# Patient Record
Sex: Female | Born: 1995 | Race: Black or African American | Hispanic: No | Marital: Single | Smoking: Current every day smoker
Health system: Southern US, Community
[De-identification: ages and names within clinical notes are randomized; demographics above are authoritative.]

## PROBLEM LIST (undated history)

## (undated) DIAGNOSIS — M1990 Unspecified osteoarthritis, unspecified site: Secondary | ICD-10-CM

---

## 2014-05-10 ENCOUNTER — Encounter (HOSPITAL_COMMUNITY): Payer: Self-pay | Admitting: Emergency Medicine

## 2014-05-10 ENCOUNTER — Emergency Department (HOSPITAL_COMMUNITY)
Admission: EM | Admit: 2014-05-10 | Discharge: 2014-05-11 | Disposition: A | Discharge status: Home / Self Care | Payer: Medicaid Other | Attending: Emergency Medicine | Admitting: Emergency Medicine

## 2014-05-10 DIAGNOSIS — Z3202 Encounter for pregnancy test, result negative: Secondary | ICD-10-CM | POA: Insufficient documentation

## 2014-05-10 DIAGNOSIS — R110 Nausea: Secondary | ICD-10-CM | POA: Diagnosis not present

## 2014-05-10 DIAGNOSIS — Z79899 Other long term (current) drug therapy: Secondary | ICD-10-CM | POA: Insufficient documentation

## 2014-05-10 DIAGNOSIS — Z791 Long term (current) use of non-steroidal anti-inflammatories (NSAID): Secondary | ICD-10-CM | POA: Insufficient documentation

## 2014-05-10 DIAGNOSIS — R1011 Right upper quadrant pain: Secondary | ICD-10-CM | POA: Diagnosis not present

## 2014-05-10 DIAGNOSIS — M1990 Unspecified osteoarthritis, unspecified site: Secondary | ICD-10-CM | POA: Insufficient documentation

## 2014-05-10 DIAGNOSIS — Z720 Tobacco use: Secondary | ICD-10-CM | POA: Insufficient documentation

## 2014-05-10 HISTORY — DX: Unspecified osteoarthritis, unspecified site: M19.90

## 2014-05-10 LAB — COMPREHENSIVE METABOLIC PANEL
ALT: 9 U/L (ref 0–35)
AST: 17 U/L (ref 0–37)
Albumin: 3.7 g/dL (ref 3.5–5.2)
Alkaline Phosphatase: 80 U/L (ref 39–117)
Anion gap: 12 (ref 5–15)
BUN: 9 mg/dL (ref 6–23)
CO2: 23 mEq/L (ref 19–32)
Calcium: 9.1 mg/dL (ref 8.4–10.5)
Chloride: 100 mEq/L (ref 96–112)
Creatinine, Ser: 0.8 mg/dL (ref 0.50–1.10)
GFR calc Af Amer: >90 mL/min (ref >90)
GLUCOSE: 97 mg/dL (ref 70–99)
Potassium: 3.8 mEq/L (ref 3.7–5.3)
SODIUM: 135 meq/L — AB (ref 137–147)
TOTAL PROTEIN: 7.5 g/dL (ref 6.0–8.3)
Total Bilirubin: 0.8 mg/dL (ref 0.3–1.2)

## 2014-05-10 LAB — URINE MICROSCOPIC-ADD ON

## 2014-05-10 LAB — CBC WITH DIFFERENTIAL/PLATELET
BASOS ABS: 0 10*3/uL (ref 0.0–0.1)
Basophils Relative: 0 % (ref 0–1)
Eosinophils Absolute: 0.1 10*3/uL (ref 0.0–0.7)
Eosinophils Relative: 0 % (ref 0–5)
HCT: 35.9 % — ABNORMAL LOW (ref 36.0–46.0)
Hemoglobin: 12.5 g/dL (ref 12.0–15.0)
LYMPHS ABS: 1.8 10*3/uL (ref 0.7–4.0)
LYMPHS PCT: 15 % (ref 12–46)
MCH: 32.6 pg (ref 26.0–34.0)
MCHC: 34.8 g/dL (ref 30.0–36.0)
MCV: 93.7 fL (ref 78.0–100.0)
Monocytes Absolute: 1.2 10*3/uL — ABNORMAL HIGH (ref 0.1–1.0)
Monocytes Relative: 10 % (ref 3–12)
Neutro Abs: 8.9 10*3/uL — ABNORMAL HIGH (ref 1.7–7.7)
Neutrophils Relative %: 75 % (ref 43–77)
Platelets: 251 10*3/uL (ref 150–400)
RBC: 3.83 MIL/uL — AB (ref 3.87–5.11)
RDW: 13.4 % (ref 11.5–15.5)
WBC: 12 10*3/uL — AB (ref 4.0–10.5)

## 2014-05-10 LAB — URINALYSIS, ROUTINE W REFLEX MICROSCOPIC
Bilirubin Urine: NEGATIVE
Glucose, UA: NEGATIVE mg/dL
Ketones, ur: NEGATIVE mg/dL
NITRITE: POSITIVE — AB
PH: 5 (ref 5.0–8.0)
Protein, ur: 30 mg/dL — AB
SPECIFIC GRAVITY, URINE: 1.014 (ref 1.005–1.030)
Urobilinogen, UA: 0.2 mg/dL (ref 0.0–1.0)

## 2014-05-10 LAB — LIPASE, BLOOD: LIPASE: 24 U/L (ref 11–59)

## 2014-05-10 LAB — POC URINE PREG, ED: PREG TEST UR: NEGATIVE

## 2014-05-10 NOTE — ED Provider Notes (Signed)
CSN: [id number]     Arrival date & time 05/10/14  2058 History   First MD Initiated Contact with Patient 05/10/14 2306     Chief Complaint  Patient presents with  . Abdominal Pain     (Consider location/radiation/quality/duration/timing/severity/associated sxs/prior Treatment) HPI 18 year old female complains of a few hours of acute onset of right upper quadrant and right flank pain somewhat colicky with nausea without vomiting without diarrhea without dysuria without vaginal bleeding without vaginal discharge without lower abdominal pain without trauma without treatment prior to arrival pain is almost gone now and she states when she had her pain her right upper quadrant of her abdomen was tender but is no longer tender her pain started out with moderate severity it is now minimal. She has no history of similar symptoms previously. She also has no fever no cough no shortness of breath no chest pain. Past Medical History  Diagnosis Date  . Arthritis    History reviewed. No pertinent past surgical history. No family history on file. History  Substance Use Topics  . Smoking status: Current Every Day Smoker  . Smokeless tobacco: Not on file  . Alcohol Use: Yes     Comment: twice a month   OB History    No data available     Review of Systems 10 Systems reviewed and are negative for acute change except as noted in the HPI.   Allergies  Review of patient's allergies indicates no known allergies.  Home Medications   Prior to Admission medications   Medication Sig Start Date End Date Taking? Authorizing Provider  cephALEXin (KEFLEX) 500 MG capsule 2 caps po bid x 7 days 05/11/14   Hurman HornJohn M Deara Bober, MD  etanercept (ENBREL SURECLICK) 50 MG/ML injection Inject 50 mg into the skin once a week.   Yes Historical Provider, MD  etonogestrel (NEXPLANON) 68 MG IMPL implant 1 each by Subdermal route once.   Yes Historical Provider, MD  HYDROcodone-acetaminophen (NORCO) 5-325 MG per tablet Take  2 tablets by mouth every 6 (six) hours as needed for severe pain. 05/11/14   Hurman HornJohn M Jayton Popelka, MD  meloxicam (MOBIC) 7.5 MG tablet Take 7.5 mg by mouth daily.   Yes Historical Provider, MD  methotrexate (RHEUMATREX) 2.5 MG tablet Take 15 mg by mouth once a week.   Yes Historical Provider, MD  metoCLOPramide (REGLAN) 10 MG tablet Take 1 tablet (10 mg total) by mouth every 6 (six) hours as needed for nausea (nausea/headache). 05/11/14   Hurman HornJohn M Argil Mahl, MD  ondansetron (ZOFRAN ODT) 8 MG disintegrating tablet 8mg  ODT q4 hours prn nausea 05/11/14   Hurman HornJohn M Dennard Vezina, MD   BP 105/56 mmHg  Pulse 69  Temp(Src) 97.7 F (36.5 C) (Oral)  Resp 17  Ht 5\' 2"  (1.575 m)  Wt 120 lb (54.432 kg)  BMI 21.94 kg/m2  SpO2 98%  LMP 04/26/2014 Physical Exam  Nursing note and vitals reviewed. Constitutional:  Awake, alert, nontoxic appearance.  HENT:  Head: Atraumatic.  Eyes: Right eye exhibits no discharge. Left eye exhibits no discharge.  Neck: Neck supple.  Cardiovascular: Normal rate and regular rhythm.   No murmur heard. Pulmonary/Chest: Effort normal and breath sounds normal. No respiratory distress. She has no wheezes. She has no rales. She exhibits no tenderness.  Pulse oximetry normal room air 100%  Abdominal: Soft. Bowel sounds are normal. She exhibits no distension and no mass. There is no tenderness. There is no rebound and no guarding.  Genitourinary:  No CVA tenderness  Musculoskeletal: She exhibits no tenderness.  Baseline ROM, no obvious new focal weakness.  Neurological:  Mental status and motor strength appears baseline for patient and situation.  Skin: No rash noted.  Psychiatric: She has a normal mood and affect.    ED Course  Procedures (including critical care time) Patient informed of clinical course, understand medical decision-making process, and agree with plan. Labs Review Labs Reviewed  URINALYSIS, ROUTINE W REFLEX MICROSCOPIC - Abnormal; Notable for the following:     APPearance CLOUDY (*)    Hgb urine dipstick LARGE (*)    Protein, ur 30 (*)    Nitrite POSITIVE (*)    Leukocytes, UA SMALL (*)    All other components within normal limits  CBC WITH DIFFERENTIAL - Abnormal; Notable for the following:    WBC 12.0 (*)    RBC 3.83 (*)    HCT 35.9 (*)    Neutro Abs 8.9 (*)    Monocytes Absolute 1.2 (*)    All other components within normal limits  COMPREHENSIVE METABOLIC PANEL - Abnormal; Notable for the following:    Sodium 135 (*)    All other components within normal limits  URINE MICROSCOPIC-ADD ON - Abnormal; Notable for the following:    Bacteria, UA FEW (*)    All other components within normal limits  URINE CULTURE  LIPASE, BLOOD  POC URINE PREG, ED    Imaging Review No results found. [place] Abdomen Complete  05/11/2014   CLINICAL DATA:  RIGHT upper quadrant and RIGHT flank pain, hematuria. Assess cholelithiasis or nephrolithiasis.  EXAM: ULTRASOUND ABDOMEN COMPLETE  COMPARISON:  None.  FINDINGS: Gallbladder: No gallstones or wall thickening visualized. No sonographic Murphy sign noted.  Common bile duct: Diameter: 2 mm  Liver: No focal lesion identified. Within normal limits in parenchymal echogenicity. Hepatopetal portal vein.  IVC: No abnormality visualized.  Pancreas: Visualized portion unremarkable.  Spleen: Size and appearance within normal limits.  Right Kidney: Length: 10.2 cm. Echogenicity within normal limits. No mass or hydronephrosis visualized.  Left Kidney: Length: 10.4 cm. Echogenicity within normal limits. No mass or hydronephrosis visualized.  Abdominal aorta: No aneurysm visualized.  Other findings: None.  IMPRESSION: Normal abdominal sonogram, specifically no sonographic findings of cholelithiasis nor nephrolithiasis/ obstructive uropathy.   Electronically Signed   By: Awilda Metroourtnay  Bloomer   On: 05/11/2014 00:34    EKG Interpretation None      MDM   Final diagnoses:  Abdominal pain, acute, right upper quadrant    I doubt any  other EMC precluding discharge at this time including, but not necessarily limited to the following:cholecysitis.    Hurman HornJohn M Chanson Teems, MD 05/23/14 [phone]

## 2014-05-10 NOTE — ED Notes (Addendum)
Pt c/o RLQ pain onset earlier tonight.  Nausea without vomiting.  Describes pain as sharp.  Pt denies vaginal discharge

## 2014-05-11 ENCOUNTER — Emergency Department (HOSPITAL_COMMUNITY): Payer: Medicaid Other

## 2014-05-11 MED ORDER — CEPHALEXIN 500 MG PO CAPS: ORAL_CAPSULE | ORAL | Status: DC

## 2014-05-11 MED ORDER — HYDROCODONE-ACETAMINOPHEN 5-325 MG PO TABS: 2.0000 | ORAL_TABLET | Freq: Four times a day (QID) | ORAL | Status: DC | PRN

## 2014-05-11 MED ORDER — ONDANSETRON 8 MG PO TBDP: ORAL_TABLET | ORAL | Status: DC

## 2014-05-11 MED ORDER — METOCLOPRAMIDE HCL 10 MG PO TABS: 10.0000 mg | ORAL_TABLET | Freq: Four times a day (QID) | ORAL | Status: DC | PRN

## 2014-05-11 NOTE — Discharge Instructions (Signed)

## 2014-05-13 LAB — URINE CULTURE

## 2014-05-15 ENCOUNTER — Telehealth (HOSPITAL_BASED_OUTPATIENT_CLINIC_OR_DEPARTMENT_OTHER): Payer: Self-pay | Admitting: Emergency Medicine

## 2014-05-15 NOTE — Telephone Encounter (Signed)
Post ED Visit - Positive Culture Follow-up  Culture report reviewed by antimicrobial stewardship pharmacist: [x]  Wes Dulaney, Pharm.D., BCPS []  Celedonio MiyamotoJeremy Frens, Pharm.D., BCPS []  Georgina PillionElizabeth Martin, Pharm.D., BCPS []  [place]h Pham, [place]arm.D., BCPS, AAHIVP []  Estella HuskMichelle Turner, Pharm.D., BCPS, AAHIVP []  Carly Sabat, Pharm.D. []  Enzo BiNathan Batchelder, [address] BoulevardPharm[address]  Positive urine culture Treated with Keflex, organism sensitive to the same and no further patient follow-up is required at this time.  Marcelle OverlieHolland, Jenel LucksKylie 05/15/2014, 11:53 AM

## 2015-08-22 ENCOUNTER — Encounter (HOSPITAL_COMMUNITY): Payer: Self-pay | Admitting: Emergency Medicine

## 2015-08-22 ENCOUNTER — Emergency Department (HOSPITAL_COMMUNITY)
Admission: EM | Admit: 2015-08-22 | Discharge: 2015-08-23 | Disposition: A | Discharge status: Home / Self Care | Payer: Medicaid Other | Attending: Emergency Medicine | Admitting: Emergency Medicine

## 2015-08-22 DIAGNOSIS — Z3202 Encounter for pregnancy test, result negative: Secondary | ICD-10-CM | POA: Diagnosis not present

## 2015-08-22 DIAGNOSIS — K2970 Gastritis, unspecified, without bleeding: Secondary | ICD-10-CM

## 2015-08-22 DIAGNOSIS — M1990 Unspecified osteoarthritis, unspecified site: Secondary | ICD-10-CM | POA: Insufficient documentation

## 2015-08-22 DIAGNOSIS — Z79899 Other long term (current) drug therapy: Secondary | ICD-10-CM | POA: Insufficient documentation

## 2015-08-22 DIAGNOSIS — Z791 Long term (current) use of non-steroidal anti-inflammatories (NSAID): Secondary | ICD-10-CM | POA: Diagnosis not present

## 2015-08-22 DIAGNOSIS — R1013 Epigastric pain: Secondary | ICD-10-CM | POA: Diagnosis present

## 2015-08-22 DIAGNOSIS — F17200 Nicotine dependence, unspecified, uncomplicated: Secondary | ICD-10-CM | POA: Diagnosis not present

## 2015-08-22 LAB — CBC
HCT: 37.8 % (ref 36.0–46.0)
Hemoglobin: 12.8 g/dL (ref 12.0–15.0)
MCH: 31.8 pg (ref 26.0–34.0)
MCHC: 33.9 g/dL (ref 30.0–36.0)
MCV: 94 fL (ref 78.0–100.0)
PLATELETS: 311 10*3/uL (ref 150–400)
RBC: 4.02 MIL/uL (ref 3.87–5.11)
RDW: 12.4 % (ref 11.5–15.5)
WBC: 9 10*3/uL (ref 4.0–10.5)

## 2015-08-22 LAB — URINALYSIS, ROUTINE W REFLEX MICROSCOPIC
Bilirubin Urine: NEGATIVE
GLUCOSE, UA: NEGATIVE mg/dL
HGB URINE DIPSTICK: NEGATIVE
KETONES UR: NEGATIVE mg/dL
Leukocytes, UA: NEGATIVE
Nitrite: NEGATIVE
PROTEIN: NEGATIVE mg/dL
Specific Gravity, Urine: 1.021 (ref 1.005–1.030)
pH: 6.5 (ref 5.0–8.0)

## 2015-08-22 LAB — COMPREHENSIVE METABOLIC PANEL
ALT: 17 U/L (ref 14–54)
AST: 21 U/L (ref 15–41)
Albumin: 3.6 g/dL (ref 3.5–5.0)
Alkaline Phosphatase: 65 U/L (ref 38–126)
Anion gap: 10 (ref 5–15)
BUN: 12 mg/dL (ref 6–20)
CHLORIDE: 105 mmol/L (ref 101–111)
CO2: 25 mmol/L (ref 22–32)
CREATININE: 0.82 mg/dL (ref 0.44–1.00)
Calcium: 9.3 mg/dL (ref 8.9–10.3)
GFR calc Af Amer: >60 mL/min (ref >60)
Glucose, Bld: 92 mg/dL (ref 65–99)
Potassium: 3.9 mmol/L (ref 3.5–5.1)
SODIUM: 140 mmol/L (ref 135–145)
Total Bilirubin: 0.7 mg/dL (ref 0.3–1.2)
Total Protein: 6.5 g/dL (ref 6.5–8.1)

## 2015-08-22 LAB — POC URINE PREG, ED: Preg Test, Ur: NEGATIVE

## 2015-08-22 LAB — LIPASE, BLOOD: LIPASE: 36 U/L (ref 11–51)

## 2015-08-22 MED ORDER — PANTOPRAZOLE SODIUM 40 MG PO TBEC
40.0000 mg | DELAYED_RELEASE_TABLET | Freq: Once | ORAL | Status: AC
  Administered 2015-08-22: 40 mg via ORAL
  Filled 2015-08-22: qty 1

## 2015-08-22 MED ORDER — ONDANSETRON 4 MG PO TBDP
4.0000 mg | ORAL_TABLET | Freq: Once | ORAL | Status: AC | PRN
  Administered 2015-08-22: 4 mg via ORAL

## 2015-08-22 MED ORDER — ONDANSETRON 4 MG PO TBDP
ORAL_TABLET | ORAL | Status: AC
  Filled 2015-08-22: qty 1

## 2015-08-22 MED ORDER — ONDANSETRON 4 MG PO TBDP
4.0000 mg | ORAL_TABLET | Freq: Once | ORAL | Status: AC
  Administered 2015-08-22: 4 mg via ORAL
  Filled 2015-08-22: qty 1

## 2015-08-22 MED ORDER — HYDROCODONE-ACETAMINOPHEN 5-325 MG PO TABS
1.0000 | ORAL_TABLET | Freq: Once | ORAL | Status: AC
  Administered 2015-08-22: 1 via ORAL
  Filled 2015-08-22: qty 1

## 2015-08-22 NOTE — ED Notes (Signed)
Pt. reports mid abdominal pain with nausea onset last week , denies emesis or diarrhea , no fever or chills .  

## 2015-08-23 MED ORDER — OMEPRAZOLE 20 MG PO CPDR: 20.0000 mg | DELAYED_RELEASE_CAPSULE | Freq: Two times a day (BID) | ORAL | Status: DC

## 2015-08-23 MED ORDER — ONDANSETRON 4 MG PO TBDP: 4.0000 mg | ORAL_TABLET | Freq: Three times a day (TID) | ORAL | Status: DC | PRN

## 2015-08-23 MED ORDER — HYDROCODONE-ACETAMINOPHEN 5-325 MG PO TABS: 1.0000 | ORAL_TABLET | ORAL | Status: DC | PRN

## 2015-08-23 MED ORDER — SUCRALFATE 1 G PO TABS: 1.0000 g | ORAL_TABLET | Freq: Four times a day (QID) | ORAL | Status: DC

## 2015-08-23 NOTE — Discharge Instructions (Signed)

## 2015-08-25 NOTE — ED Provider Notes (Signed)
CSN: [id number]     Arrival date & time 08/22/15  2208 History   First MD Initiated Contact with Patient 08/22/15 2253     Chief Complaint  Patient presents with  . Abdominal Pain      HPI  History presents for evaluation of abdominal pain some mid epigastric and upper abdominal pain over the last several days. States it makes for full when she eats has some irritation of her upper abdomen with nausea. No vomiting. No diarrhea. No dysuria free C hematuria. Past history significant for rheumatoid arthritis. Takes methotrexate, Mavik, and Enbrel.  Past Medical History  Diagnosis Date  . Arthritis    History reviewed. No pertinent past surgical history. No family history on file. Social History  Substance Use Topics  . Smoking status: Current Every Day Smoker  . Smokeless tobacco: None  . Alcohol Use: Yes     Comment: twice a month   OB History    No data available     Review of Systems  Constitutional: Negative for fever, chills, diaphoresis, appetite change and fatigue.  HENT: Negative for mouth sores, sore throat and trouble swallowing.   Eyes: Negative for visual disturbance.  Respiratory: Negative for cough, chest tightness, shortness of breath and wheezing.   Cardiovascular: Negative for chest pain.  Gastrointestinal: Positive for abdominal pain. Negative for nausea, vomiting, diarrhea and abdominal distention.  Endocrine: Negative for polydipsia, polyphagia and polyuria.  Genitourinary: Negative for dysuria, frequency and hematuria.  Musculoskeletal: Negative for gait problem.  Skin: Negative for color change, pallor and rash.  Neurological: Negative for dizziness, syncope, light-headedness and headaches.  Hematological: Does not bruise/bleed easily.  Psychiatric/Behavioral: Negative for behavioral problems and confusion.      Allergies  Review of patient's allergies indicates no known allergies.  Home Medications   Prior to Admission medications   Medication  Sig Start Date End Date Taking? Authorizing Provider  cephALEXin (KEFLEX) 500 MG capsule 2 caps po bid x 7 days 05/11/14   Wayland Salinas, MD  etanercept (ENBREL SURECLICK) 50 MG/ML injection Inject 50 mg into the skin once a week.    Historical Provider, MD  etonogestrel (NEXPLANON) 68 MG IMPL implant 1 each by Subdermal route once.    Historical Provider, MD  HYDROcodone-acetaminophen (NORCO/VICODIN) 5-325 MG tablet Take 1 tablet by mouth every 4 (four) hours as needed. 08/23/15   Rolland Porter, MD  meloxicam (MOBIC) 7.5 MG tablet Take 7.5 mg by mouth daily.    Historical Provider, MD  methotrexate (RHEUMATREX) 2.5 MG tablet Take 15 mg by mouth once a week.    Historical Provider, MD  metoCLOPramide (REGLAN) 10 MG tablet Take 1 tablet (10 mg total) by mouth every 6 (six) hours as needed for nausea (nausea/headache). 05/11/14   Wayland Salinas, MD  omeprazole (PRILOSEC) 20 MG capsule Take 1 capsule (20 mg total) by mouth 2 (two) times daily. 08/23/15   Rolland Porter, MD  ondansetron (ZOFRAN ODT) 4 MG disintegrating tablet Take 1 tablet (4 mg total) by mouth every 8 (eight) hours as needed for nausea. 08/23/15   Rolland Porter, MD  sucralfate (CARAFATE) 1 g tablet Take 1 tablet (1 g total) by mouth 4 (four) times daily. 08/23/15   Rolland Porter, MD   BP 108/75 mmHg  Pulse 64  Temp(Src) 97.5 F (36.4 C) (Oral)  Resp 18  SpO2 100%  LMP 08/15/2015 (Approximate) Physical Exam  Constitutional: She is oriented to person, place, and time. She appears well-developed and well-nourished. No distress.  HENT:  Head: Normocephalic.  Eyes: Conjunctivae are normal. Pupils are equal, round, and reactive to light. No scleral icterus.  Neck: Normal range of motion. Neck supple. No thyromegaly present.  Cardiovascular: Normal rate and regular rhythm.  Exam reveals no gallop and no friction rub.   No murmur heard. Pulmonary/Chest: Effort normal and breath sounds normal. No respiratory distress. She has no wheezes. She has no rales.    Abdominal: Soft. Bowel sounds are normal. She exhibits no distension. There is no tenderness. There is no rebound.    Musculoskeletal: Normal range of motion.  Neurological: She is alert and oriented to person, place, and time.  Skin: Skin is warm and dry. No rash noted.  Psychiatric: She has a normal mood and affect. Her behavior is normal.    ED Course  Procedures (including critical care time) Labs Review Labs Reviewed  URINALYSIS, ROUTINE W REFLEX MICROSCOPIC (NOT AT [facility]) - Abnormal; Notable for the following:    APPearance HAZY (*)    All other components within normal limits  LIPASE, BLOOD  COMPREHENSIVE METABOLIC PANEL  CBC  POC URINE PREG, ED    Imaging Review No results found. I have personally reviewed and evaluated these images and lab results as part of my medical decision-making.   EKG Interpretation None      MDM   Final diagnoses:  Gastritis    Reassuring evaluation and workup. I'm aware that she takes Enbrel. No sign of infectious etiology. Symptoms are likely gastritis. We'll hold her mobile. Plan Carafate and Protonix. Primary care follow-up. Avoid alcohol tobacco caffeine anti-inflammatories.    Rolland Porter, MD 08/25/15 [phone]

## 2015-10-10 DIAGNOSIS — Z79899 Other long term (current) drug therapy: Secondary | ICD-10-CM | POA: Insufficient documentation

## 2015-10-10 DIAGNOSIS — M79671 Pain in right foot: Secondary | ICD-10-CM | POA: Diagnosis present

## 2015-10-10 DIAGNOSIS — F17200 Nicotine dependence, unspecified, uncomplicated: Secondary | ICD-10-CM | POA: Insufficient documentation

## 2015-10-10 DIAGNOSIS — M79674 Pain in right toe(s): Secondary | ICD-10-CM | POA: Diagnosis not present

## 2015-10-10 DIAGNOSIS — M1990 Unspecified osteoarthritis, unspecified site: Secondary | ICD-10-CM | POA: Diagnosis not present

## 2015-10-10 DIAGNOSIS — Z791 Long term (current) use of non-steroidal anti-inflammatories (NSAID): Secondary | ICD-10-CM | POA: Insufficient documentation

## 2015-10-11 ENCOUNTER — Emergency Department (HOSPITAL_COMMUNITY): Payer: Medicaid Other

## 2015-10-11 ENCOUNTER — Emergency Department (HOSPITAL_COMMUNITY)
Admission: EM | Admit: 2015-10-11 | Discharge: 2015-10-11 | Disposition: A | Discharge status: Home / Self Care | Payer: Medicaid Other | Attending: Emergency Medicine | Admitting: Emergency Medicine

## 2015-10-11 ENCOUNTER — Encounter (HOSPITAL_COMMUNITY): Payer: Self-pay | Admitting: Emergency Medicine

## 2015-10-11 DIAGNOSIS — M79674 Pain in right toe(s): Secondary | ICD-10-CM

## 2015-10-11 MED ORDER — OXYCODONE-ACETAMINOPHEN 5-325 MG PO TABS: 1.0000 | ORAL_TABLET | Freq: Four times a day (QID) | ORAL | Status: DC | PRN

## 2015-10-11 MED ORDER — OXYCODONE-ACETAMINOPHEN 5-325 MG PO TABS
ORAL_TABLET | ORAL | Status: AC
  Filled 2015-10-11: qty 1

## 2015-10-11 MED ORDER — OXYCODONE-ACETAMINOPHEN 5-325 MG PO TABS
1.0000 | ORAL_TABLET | ORAL | Status: DC | PRN
Start: 2015-10-11 — End: 2015-10-11
  Administered 2015-10-11: 1 via ORAL

## 2015-10-11 NOTE — Discharge Instructions (Signed)
You were seen today for pain in your right great toe. Your workup is reassuring. Her x-rays negative. This could be related to your known arthritis. He will be given a short course of additional pain medication. If your symptoms worsen, you need to follow-up with your primary physician. If you develop redness or fever, you should be reevaluated.  Joint Pain Joint pain, which is also called arthralgia, can be caused by many things. Joint pain often goes away when you follow your health care provider's instructions for relieving pain at home. However, joint pain can also be caused by conditions that require further treatment. Common causes of joint pain include:  Bruising in the area of the joint.  Overuse of the joint.  Wear and tear on the joints that occur with aging (osteoarthritis).  Various other forms of arthritis.  A buildup of a crystal form of uric acid in the joint (gout).  Infections of the joint (septic arthritis) or of the bone (osteomyelitis). Your health care provider may recommend medicine to help with the pain. If your joint pain continues, additional tests may be needed to diagnose your condition. HOME CARE INSTRUCTIONS Watch your condition for any changes. Follow these instructions as directed to lessen the pain that you are feeling.  Take medicines only as directed by your health care provider.  Rest the affected area for as long as your health care provider says that you should. If directed to do so, raise the painful joint above the level of your heart while you are sitting or lying down.  Do not do things that cause or worsen pain.  If directed, apply ice to the painful area:  Put ice in a plastic bag.  Place a towel between your skin and the bag.  Leave the ice on for 20 minutes, 2-3 times per day.  Wear an elastic bandage, splint, or sling as directed by your health care provider. Loosen the elastic bandage or splint if your fingers or toes become numb and  tingle, or if they turn cold and blue.  Begin exercising or stretching the affected area as directed by your health care provider. Ask your health care provider what types of exercise are safe for you.  Keep all follow-up visits as directed by your health care provider. This is important. SEEK MEDICAL CARE IF:  Your pain increases, and medicine does not help.  Your joint pain does not improve within 3 days.  You have increased bruising or swelling.  You have a fever.  You lose 10 lb (4.5 kg) or more without trying. SEEK IMMEDIATE MEDICAL CARE IF:  You are not able to move the joint.  Your fingers or toes become numb or they turn cold and blue.   This information is not intended to replace advice given to you by your health care provider. Make sure you discuss any questions you have with your health care provider.   Document Released: 07/09/2005 Document Revised: 07/30/2014 Document Reviewed: 04/20/2014 Elsevier Interactive Patient Education [employer]2016 [employer] Inc.

## 2015-10-11 NOTE — ED Notes (Signed)
Pt. Reports left foot pain with mild swelling for 2 weeks , denies injury , pain increases when walking/weight bearing , history of arthritis .

## 2015-10-11 NOTE — ED Provider Notes (Signed)
CSN: [id number]     Arrival date & time 10/10/15  2356 History  By signing my name below, I, Bethel BornBritney McCollum, attest that this documentation has been prepared under the direction and in the presence of Shon Batonourtney F Seila Liston, MD. Electronically Signed: Bethel BornBritney McCollum, ED Scribe. 10/11/2015. 2:19 AM  Chief Complaint  Patient presents with  . Foot Pain   The history is provided by the patient. No language interpreter was used.   Erin Huang is a 20 y.o. female with history of arthritis who presents to the Emergency Department complaining of new, constant, atraumatic, sharp/throbbing, 10/10 in severity at worse, 2/10 in severity at present, left foot pain and swelling with onset 1 week ago. She notes that the pain is exacerbated by weight bearing and worse at night.  This pain is different than her typical arthritis pain which is dull and in her hands. Her arthritis is treated with meloxicam and weekly injections of Enbrel Sureclick.   Pt denies fever.   Past Medical History  Diagnosis Date  . Arthritis    History reviewed. No pertinent past surgical history. No family history on file. Social History  Substance Use Topics  . Smoking status: Current Every Day Smoker  . Smokeless tobacco: None  . Alcohol Use: Yes     Comment: twice a month   OB History    No data available     Review of Systems  Constitutional: Negative for fever.  Musculoskeletal:       Left foot pain   All other systems reviewed and are negative.     Allergies  Review of patient's allergies indicates no known allergies.  Home Medications   Prior to Admission medications   Medication Sig Start Date End Date Taking? Authorizing Provider  cephALEXin (KEFLEX) 500 MG capsule 2 caps po bid x 7 days 05/11/14   Wayland SalinasJohn Bednar, MD  etanercept (ENBREL SURECLICK) 50 MG/ML injection Inject 50 mg into the skin once a week.    Historical Provider, MD  etonogestrel (NEXPLANON) 68 MG IMPL implant 1 each by Subdermal route  once.    Historical Provider, MD  HYDROcodone-acetaminophen (NORCO/VICODIN) 5-325 MG tablet Take 1 tablet by mouth every 4 (four) hours as needed. 08/23/15   Rolland PorterMark James, MD  meloxicam (MOBIC) 7.5 MG tablet Take 7.5 mg by mouth daily.    Historical Provider, MD  methotrexate (RHEUMATREX) 2.5 MG tablet Take 15 mg by mouth once a week.    Historical Provider, MD  metoCLOPramide (REGLAN) 10 MG tablet Take 1 tablet (10 mg total) by mouth every 6 (six) hours as needed for nausea (nausea/headache). 05/11/14   Wayland SalinasJohn Bednar, MD  omeprazole (PRILOSEC) 20 MG capsule Take 1 capsule (20 mg total) by mouth 2 (two) times daily. 08/23/15   Rolland PorterMark James, MD  ondansetron (ZOFRAN ODT) 4 MG disintegrating tablet Take 1 tablet (4 mg total) by mouth every 8 (eight) hours as needed for nausea. 08/23/15   Rolland PorterMark James, MD  oxyCODONE-acetaminophen (PERCOCET/ROXICET) 5-325 MG tablet Take 1 tablet by mouth every 6 (six) hours as needed for moderate pain. 10/11/15   Shon Batonourtney F Giavonni Fonder, MD  sucralfate (CARAFATE) 1 g tablet Take 1 tablet (1 g total) by mouth 4 (four) times daily. 08/23/15   Rolland PorterMark James, MD   BP 104/65 mmHg  Pulse 86  Temp(Src) 98.4 F (36.9 C) (Oral)  Resp 16  Ht 5\' 2"  (1.575 m)  Wt 132 lb (59.875 kg)  BMI 24.14 kg/m2  SpO2 99%  LMP 10/06/2015 (Approximate)  Physical Exam  Constitutional: She is oriented to person, place, and time. She appears well-developed and well-nourished.  HENT:  Head: Normocephalic and atraumatic.  Cardiovascular: Normal rate, regular rhythm and normal heart sounds.   Pulmonary/Chest: Effort normal and breath sounds normal. No respiratory distress.  Musculoskeletal: She exhibits no edema.  Normal range of motion of the left great toe, no obvious swelling, no overlying skin changes, no warmth or erythema noted, 2+ DP pulse  Neurological: She is alert and oriented to person, place, and time.  Skin: Skin is warm and dry.  Psychiatric: She has a normal mood and affect.  Nursing note and  vitals reviewed.   ED Course  Procedures (including critical care time) DIAGNOSTIC STUDIES: Oxygen Saturation is 99% on RA,  normal by my interpretation.    COORDINATION OF CARE: 2:15 AM Discussed treatment plan which includes left foot XR and pain management with pt at bedside and pt agreed to plan.  Labs Review Labs Reviewed - No data to display  Imaging Review Dg Toe Great Left  10/11/2015  CLINICAL DATA:  Left great toe pain, 2 weeks duration. EXAM: LEFT GREAT TOE COMPARISON:  None. FINDINGS: There is no evidence of fracture or dislocation. There is no evidence of arthropathy or other focal bone abnormality. Soft tissues are unremarkable. IMPRESSION: Negative. Electronically Signed   By: Ellery Plunk M.D.   On: 10/11/2015 02:52   I have personally reviewed and evaluated these images as part of my medical decision-making.   EKG Interpretation None      MDM   Final diagnoses:  Great toe pain, right    Patient presents with right great toe pain. 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 Pain improved with pain medication in triage. There are no obvious signs of infection. No obvious swelling. There is some tenderness to palpation. Denies injury. Plain films are negative. While patient states that this feels different than her arthritis, this could be related. Will discharge home with a short course of additional pain medication given that the patient has artery on an anti-inflammatory. Follow-up with primary physician.  After history, exam, and medical workup I feel the patient has been appropriately medically screened and is safe for discharge home. Pertinent diagnoses were discussed with the patient. Patient was given return precautions.  I personally performed the services described in this documentation, which was scribed in my presence. The recorded information has been reviewed and is accurate.    Shon Baton, MD 10/11/15 [phone]

## 2017-10-01 ENCOUNTER — Emergency Department (HOSPITAL_COMMUNITY)
Admission: EM | Admit: 2017-10-01 | Discharge: 2017-10-02 | Disposition: A | Discharge status: Home / Self Care | Payer: BLUE CROSS/BLUE SHIELD | Attending: Emergency Medicine | Admitting: Emergency Medicine

## 2017-10-01 ENCOUNTER — Encounter (HOSPITAL_COMMUNITY): Payer: Self-pay | Admitting: Emergency Medicine

## 2017-10-01 ENCOUNTER — Other Ambulatory Visit: Payer: Self-pay

## 2017-10-01 DIAGNOSIS — R509 Fever, unspecified: Secondary | ICD-10-CM | POA: Insufficient documentation

## 2017-10-01 DIAGNOSIS — R112 Nausea with vomiting, unspecified: Secondary | ICD-10-CM | POA: Insufficient documentation

## 2017-10-01 DIAGNOSIS — Z79899 Other long term (current) drug therapy: Secondary | ICD-10-CM | POA: Insufficient documentation

## 2017-10-01 DIAGNOSIS — F17200 Nicotine dependence, unspecified, uncomplicated: Secondary | ICD-10-CM | POA: Diagnosis not present

## 2017-10-01 DIAGNOSIS — R1013 Epigastric pain: Secondary | ICD-10-CM | POA: Insufficient documentation

## 2017-10-01 DIAGNOSIS — R1011 Right upper quadrant pain: Secondary | ICD-10-CM | POA: Diagnosis present

## 2017-10-01 DIAGNOSIS — R52 Pain, unspecified: Secondary | ICD-10-CM

## 2017-10-01 LAB — URINALYSIS, ROUTINE W REFLEX MICROSCOPIC
BILIRUBIN URINE: NEGATIVE
Glucose, UA: NEGATIVE mg/dL
Hgb urine dipstick: NEGATIVE
Ketones, ur: NEGATIVE mg/dL
LEUKOCYTES UA: NEGATIVE
NITRITE: NEGATIVE
Protein, ur: NEGATIVE mg/dL
SPECIFIC GRAVITY, URINE: 1.005 (ref 1.005–1.030)
pH: 6 (ref 5.0–8.0)

## 2017-10-01 LAB — COMPREHENSIVE METABOLIC PANEL
ALK PHOS: 60 U/L (ref 38–126)
ALT: 18 U/L (ref 14–54)
ANION GAP: 10 (ref 5–15)
AST: 22 U/L (ref 15–41)
Albumin: 3.8 g/dL (ref 3.5–5.0)
BUN: 8 mg/dL (ref 6–20)
CO2: 23 mmol/L (ref 22–32)
Calcium: 8.7 mg/dL — ABNORMAL LOW (ref 8.9–10.3)
Chloride: 99 mmol/L — ABNORMAL LOW (ref 101–111)
Creatinine, Ser: 0.85 mg/dL (ref 0.44–1.00)
GLUCOSE: 102 mg/dL — AB (ref 65–99)
POTASSIUM: 3.4 mmol/L — AB (ref 3.5–5.1)
Sodium: 132 mmol/L — ABNORMAL LOW (ref 135–145)
TOTAL PROTEIN: 7.1 g/dL (ref 6.5–8.1)
Total Bilirubin: 1.3 mg/dL — ABNORMAL HIGH (ref 0.3–1.2)

## 2017-10-01 LAB — LIPASE, BLOOD: Lipase: 26 U/L (ref 11–51)

## 2017-10-01 LAB — CBC
HEMATOCRIT: 39.9 % (ref 36.0–46.0)
Hemoglobin: 13.7 g/dL (ref 12.0–15.0)
MCH: 33 pg (ref 26.0–34.0)
MCHC: 34.3 g/dL (ref 30.0–36.0)
MCV: 96.1 fL (ref 78.0–100.0)
Platelets: 262 10*3/uL (ref 150–400)
RBC: 4.15 MIL/uL (ref 3.87–5.11)
RDW: 12.8 % (ref 11.5–15.5)
WBC: 6.6 10*3/uL (ref 4.0–10.5)

## 2017-10-01 LAB — I-STAT BETA HCG BLOOD, ED (MC, WL, AP ONLY)

## 2017-10-01 MED ORDER — ONDANSETRON 4 MG PO TBDP
4.0000 mg | ORAL_TABLET | Freq: Once | ORAL | Status: AC | PRN
  Administered 2017-10-01: 4 mg via ORAL
  Filled 2017-10-01: qty 1

## 2017-10-01 NOTE — ED Triage Notes (Signed)
Pt reports multiple complaints that that include headache, n/v/stomach cramping, constipation, body aches, and general weakness.

## 2017-10-02 ENCOUNTER — Emergency Department (HOSPITAL_COMMUNITY): Payer: BLUE CROSS/BLUE SHIELD

## 2017-10-02 MED ORDER — SODIUM CHLORIDE 0.9 % IV BOLUS (SEPSIS)
1000.0000 mL | Freq: Once | INTRAVENOUS | Status: AC
  Administered 2017-10-02: 1000 mL via INTRAVENOUS

## 2017-10-02 MED ORDER — OMEPRAZOLE 20 MG PO CPDR: 20.0000 mg | DELAYED_RELEASE_CAPSULE | Freq: Every day | ORAL | 0 refills | Status: DC

## 2017-10-02 MED ORDER — FENTANYL CITRATE (PF) 100 MCG/2ML IJ SOLN
50.0000 ug | Freq: Once | INTRAMUSCULAR | Status: AC
  Administered 2017-10-02: 50 ug via INTRAVENOUS
  Filled 2017-10-02: qty 2

## 2017-10-02 NOTE — ED Provider Notes (Signed)
[facility] EMERGENCY DEPARTMENT Provider Note   CSN: [id number] Arrival date & time: 10/01/17  2132     History   Chief Complaint Chief Complaint  Patient presents with  . multiple complaints    HPI Erin Huang is a 22 y.o. female.  The history is provided by the patient.  Abdominal Pain   This is a new problem. The current episode started 2 days ago. The problem occurs daily. The problem has been gradually worsening. The pain is located in the RUQ and epigastric region. The quality of the pain is cramping. The pain is moderate. Associated symptoms include fever, nausea and vomiting. Pertinent negatives include dysuria. The symptoms are aggravated by palpation and certain positions. Nothing relieves the symptoms.  Patient reports onset of nausea vomiting upper abdominal pain over the past 1-2 days.  She also reports feeling feverish.  No chest pain or shortness of breath.  No lower abdominal pain.  No dysuria   Past Medical History:  Diagnosis Date  . Arthritis     There are no active problems to display for this patient.   History reviewed. No pertinent surgical history.  OB History    No data available       Home Medications    Prior to Admission medications   Medication Sig Start Date End Date Taking? Authorizing Provider  etanercept (ENBREL SURECLICK) 50 MG/ML injection Inject 50 mg into the skin 2 (two) times a week.    Yes [provider]  meloxicam (MOBIC) 7.5 MG tablet Take 7.5 mg by mouth daily.   Yes [provider]  methotrexate (RHEUMATREX) 2.5 MG tablet Take 15 mg by mouth once a week. On Sunday   Yes [provider]  omeprazole (PRILOSEC) 20 MG capsule Take 1 capsule (20 mg total) by mouth daily. 10/02/17   Zadie Rhine, MD    Family History No family history on file.  Social History Social History   Tobacco Use  . Smoking status: Current Every Day Smoker  Substance Use Topics  . Alcohol use:  Yes    Comment: twice a month  . Drug use: Yes    Types: Marijuana     Allergies   Patient has no known allergies.   Review of Systems Review of Systems  Constitutional: Positive for fever.  Gastrointestinal: Positive for abdominal pain, nausea and vomiting.  Genitourinary: Negative for dysuria.  All other systems reviewed and are negative.    Physical Exam Updated Vital Signs BP 98/67   Pulse 75   Temp 99.5 F (37.5 C) (Oral)   Resp 18   Ht 1.575 m (5\' 2" )   Wt 59 kg (130 lb)   SpO2 99%   BMI 23.78 kg/m   Physical Exam CONSTITUTIONAL: Well developed/well nourished HEAD: Normocephalic/atraumatic EYES: EOMI/PERRL, no icterus ENMT: Mucous membranes moist NECK: supple no meningeal signs SPINE/BACK:entire spine nontender CV: S1/S2 noted, no murmurs/rubs/gallops noted LUNGS: Lungs are clear to auscultation bilaterally, no apparent distress ABDOMEN: soft, mild right upper quadrant and, no rebound or guarding, bowel sounds noted throughout abdomen GU:no cva tenderness NEURO: Pt is awake/alert/appropriate, moves all extremitiesx4.  No facial droop.   EXTREMITIES: pulses normal/equal, full ROM SKIN: warm, color normal PSYCH: no abnormalities of mood noted, alert and oriented to situation   ED Treatments / Results  Labs (all labs ordered are listed, but only abnormal results are displayed) Labs Reviewed  COMPREHENSIVE METABOLIC PANEL - Abnormal; Notable for the following components:  Result Value   Sodium 132 (*)    Potassium 3.4 (*)    Chloride 99 (*)    Glucose, Bld 102 (*)    Calcium 8.7 (*)    Total Bilirubin 1.3 (*)    All other components within normal limits  URINALYSIS, ROUTINE W REFLEX MICROSCOPIC - Abnormal; Notable for the following components:   Color, Urine STRAW (*)    All other components within normal limits  LIPASE, BLOOD  CBC  I-STAT BETA HCG BLOOD, ED ([facility], [facility], [facility] ONLY)    EKG  EKG Interpretation None       Radiology [place]  Abdomen Limited Ruq  Result Date: 10/02/2017 CLINICAL DATA:  Right upper quadrant pain EXAM: ULTRASOUND ABDOMEN LIMITED RIGHT UPPER QUADRANT COMPARISON:  None. FINDINGS: Gallbladder: No gallstones or wall thickening visualized. No sonographic Murphy sign noted by sonographer. Common bile duct: Diameter: 3 mm Liver: No focal lesion identified. Within normal limits in parenchymal echogenicity. Portal vein is patent on color Doppler imaging with normal direction of blood flow towards the liver. IMPRESSION: Normal right upper quadrant ultrasound Electronically Signed   By: Deatra RobinsonKevin  Herman M.D.   On: 10/02/2017 06:48    Procedures Procedures   Medications Ordered in ED Medications  ondansetron (ZOFRAN-ODT) disintegrating tablet 4 mg (4 mg Oral Given 10/01/17 2229)  sodium chloride 0.9 % bolus 1,000 mL (1,000 mLs Intravenous New Bag/Given 10/02/17 0536)  fentaNYL (SUBLIMAZE) injection 50 mcg (50 mcg Intravenous Given 10/02/17 0536)     Initial Impression / Assessment and Plan / ED Course  I have reviewed the triage vital signs and the nursing notes.  Pertinent labs  results that were available during my care of the patient were reviewed by me and considered in my medical decision making (see chart for details).     Patient presents with reported fevers, nausea vomiting and abdominal pain.  She is focally tender in her upper abdomen.  Ultrasound imaging is been negative. She is feeling improved.  Vitals appropriate We will discharge home  Patient is appropriate for d/c home.  I doubt acute abdominal emergency at this time.  We discussed strict ER return precautions including abdominal pain that migrates to RLQ, fever >100.7F over next 8-12 hours  Final Clinical Impressions(s) / ED Diagnoses   Final diagnoses:  Pain  Epigastric abdominal pain    ED Discharge Orders        Ordered    omeprazole (PRILOSEC) 20 MG capsule  Daily     10/02/17 0714       Zadie RhineWickline, Eliel Dudding, MD 10/02/17  [phone]

## 2017-10-02 NOTE — Discharge Instructions (Signed)

## 2018-01-22 ENCOUNTER — Other Ambulatory Visit: Payer: Self-pay | Admitting: Pediatric Rheumatology

## 2018-01-22 DIAGNOSIS — M083 Juvenile rheumatoid polyarthritis (seronegative): Secondary | ICD-10-CM

## 2018-01-27 ENCOUNTER — Inpatient Hospital Stay
Admission: RE | Admit: 2018-01-27 | Discharge: 2018-01-27 | Disposition: A | Discharge status: Home / Self Care | Payer: BLUE CROSS/BLUE SHIELD | Source: Ambulatory Visit | Attending: Pediatric Rheumatology | Admitting: Pediatric Rheumatology

## 2018-02-05 ENCOUNTER — Ambulatory Visit
Admission: RE | Admit: 2018-02-05 | Discharge: 2018-02-05 | Disposition: A | Discharge status: Home / Self Care | Payer: BLUE CROSS/BLUE SHIELD | Source: Ambulatory Visit | Attending: Pediatric Rheumatology | Admitting: Pediatric Rheumatology

## 2018-02-05 DIAGNOSIS — M083 Juvenile rheumatoid polyarthritis (seronegative): Secondary | ICD-10-CM

## 2018-02-05 MED ORDER — GADOBENATE DIMEGLUMINE 529 MG/ML IV SOLN
13.0000 mL | Freq: Once | INTRAVENOUS | Status: AC | PRN
  Administered 2018-02-05: 13 mL via INTRAVENOUS

## 2018-08-30 ENCOUNTER — Encounter (HOSPITAL_COMMUNITY): Payer: Self-pay

## 2018-08-30 ENCOUNTER — Other Ambulatory Visit: Payer: Self-pay

## 2018-08-30 ENCOUNTER — Emergency Department (HOSPITAL_COMMUNITY): Payer: Managed Care, Other (non HMO)

## 2018-08-30 ENCOUNTER — Emergency Department (HOSPITAL_COMMUNITY)
Admission: EM | Admit: 2018-08-30 | Discharge: 2018-08-30 | Disposition: A | Discharge status: Home / Self Care | Payer: Managed Care, Other (non HMO) | Attending: Emergency Medicine | Admitting: Emergency Medicine

## 2018-08-30 DIAGNOSIS — R1013 Epigastric pain: Secondary | ICD-10-CM | POA: Diagnosis not present

## 2018-08-30 DIAGNOSIS — Z79899 Other long term (current) drug therapy: Secondary | ICD-10-CM | POA: Diagnosis not present

## 2018-08-30 DIAGNOSIS — F17210 Nicotine dependence, cigarettes, uncomplicated: Secondary | ICD-10-CM | POA: Insufficient documentation

## 2018-08-30 DIAGNOSIS — R102 Pelvic and perineal pain: Secondary | ICD-10-CM | POA: Insufficient documentation

## 2018-08-30 LAB — URINALYSIS, ROUTINE W REFLEX MICROSCOPIC
Bacteria, UA: NONE SEEN
Bilirubin Urine: NEGATIVE
GLUCOSE, UA: NEGATIVE mg/dL
Ketones, ur: 5 mg/dL — AB
Leukocytes, UA: NEGATIVE
NITRITE: NEGATIVE
Protein, ur: NEGATIVE mg/dL
SPECIFIC GRAVITY, URINE: 1.025 (ref 1.005–1.030)
pH: 5 (ref 5.0–8.0)

## 2018-08-30 LAB — COMPREHENSIVE METABOLIC PANEL
ALT: 14 U/L (ref 0–44)
AST: 20 U/L (ref 15–41)
Albumin: 4.2 g/dL (ref 3.5–5.0)
Alkaline Phosphatase: 55 U/L (ref 38–126)
Anion gap: 7 (ref 5–15)
BILIRUBIN TOTAL: 0.8 mg/dL (ref 0.3–1.2)
BUN: 15 mg/dL (ref 6–20)
CO2: 24 mmol/L (ref 22–32)
CREATININE: 0.69 mg/dL (ref 0.44–1.00)
Calcium: 8.7 mg/dL — ABNORMAL LOW (ref 8.9–10.3)
Chloride: 105 mmol/L (ref 98–111)
GFR calc Af Amer: >60 mL/min (ref >60)
Glucose, Bld: 82 mg/dL (ref 70–99)
POTASSIUM: 3.4 mmol/L — AB (ref 3.5–5.1)
Sodium: 136 mmol/L (ref 135–145)
TOTAL PROTEIN: 7.6 g/dL (ref 6.5–8.1)

## 2018-08-30 LAB — I-STAT BETA HCG BLOOD, ED (MC, WL, AP ONLY): I-stat hCG, quantitative: <5 m[IU]/mL (ref <5)

## 2018-08-30 LAB — CBC
HCT: 37.8 % (ref 36.0–46.0)
Hemoglobin: 12.6 g/dL (ref 12.0–15.0)
MCH: 32.6 pg (ref 26.0–34.0)
MCHC: 33.3 g/dL (ref 30.0–36.0)
MCV: 97.7 fL (ref 80.0–100.0)
PLATELETS: 269 10*3/uL (ref 150–400)
RBC: 3.87 MIL/uL (ref 3.87–5.11)
RDW: 11.9 % (ref 11.5–15.5)
WBC: 5.6 10*3/uL (ref 4.0–10.5)
nRBC: 0 % (ref 0.0–0.2)

## 2018-08-30 LAB — LIPASE, BLOOD: Lipase: 38 U/L (ref 11–51)

## 2018-08-30 MED ORDER — IOPAMIDOL (ISOVUE-300) INJECTION 61%
INTRAVENOUS | Status: AC
  Filled 2018-08-30: qty 100

## 2018-08-30 MED ORDER — SODIUM CHLORIDE (PF) 0.9 % IJ SOLN
INTRAMUSCULAR | Status: AC
  Filled 2018-08-30: qty 50

## 2018-08-30 MED ORDER — SODIUM CHLORIDE 0.9% FLUSH: 3.0000 mL | Freq: Once | INTRAVENOUS | Status: DC

## 2018-08-30 MED ORDER — OMEPRAZOLE 20 MG PO CPDR: 20.0000 mg | DELAYED_RELEASE_CAPSULE | Freq: Two times a day (BID) | ORAL | 0 refills | Status: AC

## 2018-08-30 MED ORDER — IOPAMIDOL (ISOVUE-300) INJECTION 61%
100.0000 mL | Freq: Once | INTRAVENOUS | Status: AC | PRN
  Administered 2018-08-30: 100 mL via INTRAVENOUS

## 2018-08-30 NOTE — ED Provider Notes (Signed)
[facility]-EMERGENCY DEPT Provider Note   CSN: [id number] Arrival date & time: 08/30/18  0124     History   Chief Complaint Chief Complaint  Patient presents with  . Abdominal Pain    HPI Erin Huang is a 23 y.o. female.  Patient is a 23 year old female with no significant past medical history.  She presents today with complaints of abdominal pain.  She states that this is been occurring intermittently over the past week.  She reports episodes of severe cramping in her upper abdomen that she states "doubles her over".  She has tried Tums with little relief.  She denies any fevers, chills, or vomiting.  She denies any diarrhea or constipation.  She states that she did recently have some bloody stool that was attributed to a rectal fissure.  This was evaluated at urgent care who started her on stool softeners.  This did seem to help that particular problem, however the pain in her epigastric region persists.  The history is provided by the patient.  Abdominal Pain  Pain location:  Epigastric Pain quality: cramping   Pain radiates to:  Does not radiate Pain severity:  Moderate Duration:  1 week Timing:  Intermittent Progression:  Worsening Chronicity:  New Relieved by:  Nothing Worsened by:  Eating Associated symptoms: no fever, no melena, no nausea and no vomiting     Past Medical History:  Diagnosis Date  . Arthritis     There are no active problems to display for this patient.   History reviewed. No pertinent surgical history.   OB History   No obstetric history on file.      Home Medications    Prior to Admission medications   Medication Sig Start Date End Date Taking? Authorizing Provider  etanercept (ENBREL SURECLICK) 50 MG/ML injection Inject 50 mg into the skin 2 (two) times a week.     [provider]  meloxicam (MOBIC) 7.5 MG tablet Take 7.5 mg by mouth daily.    [provider]  methotrexate (RHEUMATREX) 2.5 MG  tablet Take 15 mg by mouth once a week. On Sunday    [provider]  omeprazole (PRILOSEC) 20 MG capsule Take 1 capsule (20 mg total) by mouth daily. 10/02/17   Zadie RhineWickline, Donald, MD    Family History History reviewed. No pertinent family history.  Social History Social History   Tobacco Use  . Smoking status: Current Every Day Smoker  Substance Use Topics  . Alcohol use: Yes    Comment: twice a month  . Drug use: Yes    Types: Marijuana     Allergies   Patient has no known allergies.   Review of Systems Review of Systems  Constitutional: Negative for fever.  Gastrointestinal: Positive for abdominal pain. Negative for melena, nausea and vomiting.  All other systems reviewed and are negative.    Physical Exam Updated Vital Signs BP 109/81 (BP Location: Right Arm)   Pulse 65   Temp 97.9 F (36.6 C) (Oral)   Resp 16   SpO2 100%   Physical Exam Vitals signs and nursing note reviewed.  Constitutional:      General: She is not in acute distress.    Appearance: She is well-developed. She is not diaphoretic.  HENT:     Head: Normocephalic and atraumatic.  Neck:     Musculoskeletal: Normal range of motion and neck supple.  Cardiovascular:     Rate and Rhythm: Normal rate and regular rhythm.  Heart sounds: No murmur. No friction rub. No gallop.   Pulmonary:     Effort: Pulmonary effort is normal. No respiratory distress.     Breath sounds: Normal breath sounds. No wheezing.  Abdominal:     General: Bowel sounds are normal. There is no distension.     Palpations: Abdomen is soft.     Tenderness: There is abdominal tenderness in the epigastric area. There is no guarding or rebound.  Musculoskeletal: Normal range of motion.  Skin:    General: Skin is warm and dry.  Neurological:     Mental Status: She is alert and oriented to person, place, and time.      ED Treatments / Results  Labs (all labs ordered are listed, but only abnormal results are  displayed) Labs Reviewed  COMPREHENSIVE METABOLIC PANEL - Abnormal; Notable for the following components:      Result Value   Potassium 3.4 (*)    Calcium 8.7 (*)    All other components within normal limits  URINALYSIS, ROUTINE W REFLEX MICROSCOPIC - Abnormal; Notable for the following components:   APPearance HAZY (*)    Hgb urine dipstick MODERATE (*)    Ketones, ur 5 (*)    All other components within normal limits  LIPASE, BLOOD  CBC  I-STAT BETA HCG BLOOD, ED ([facility], [facility], [facility] ONLY)    EKG None  Radiology No results found.  Procedures Procedures (including critical care time)  Medications Ordered in ED Medications  sodium chloride flush (NS) 0.9 % injection 3 mL (has no administration in time range)     Initial Impression / Assessment and Plan / ED Course  I have reviewed the triage vital signs and the nursing notes.  Pertinent labs & imaging results that were available during my care of the patient were reviewed by me and considered in my medical decision making (see chart for details).  Patient presents here with epigastric pain, the etiology of which I am uncertain.  Laboratory studies are all unremarkable.  There is no elevation of white count, LFTs are unremarkable, and lipase is normal.  She had a right upper quadrant ultrasound in the past year showing no gallstones or other abnormality.  CT scan today shows no acute intra-abdominal pathology.  At this point, I see nothing emergent and believe she is appropriate for discharge.  She will be treated for gastritis with Prilosec.  She will be given the number for the GI clinic with whom she can follow-up if she has additional issues or does not improve.    Final Clinical Impressions(s) / ED Diagnoses   Final diagnoses:  None    ED Discharge Orders    None       Geoffery Lyons, MD 08/30/18 [phone]

## 2018-08-30 NOTE — Discharge Instructions (Signed)
Begin taking Prilosec as prescribed today.  Follow-up with [facility] if symptoms or not improving in the next week.  Their contact information has been provided in this discharge summary for you to call and make these arrangements.  Return to the emergency department in the meantime if you develop worsening pain, high fever, or other new and concerning symptoms.

## 2018-08-30 NOTE — ED Triage Notes (Signed)
Pt reports abdominal cramping in her upper abdomen. She was seen at [facility] for rectal bleeding and and they dx'd her with anal fissures which has improved, but this pain remains. She also endorses diarrhea and lack of appetite.

## 2020-08-26 IMAGING — CT CT ABD-PELV W/ CM
2 of 4 series · 16 of 46 positions shown, 18 images · IV contrast (ISOVUE)
Comparison: Prior ultrasound from 10/02/2017.

CLINICAL DATA: Initial evaluation for acute generalized abdominal
pain.

EXAM:
CT ABDOMEN AND PELVIS WITH CONTRAST
TECHNIQUE: Multidetector CT imaging of the abdomen and pelvis was performed
using the standard protocol following bolus administration of
intravenous contrast.
CONTRAST:  100mL [id number]-AXX IOPAMIDOL (6O86XH-AXX) INJECTION 61%

[Series 2: axial st · axial · 0.58mm/px · z∈[-506,-146]mm · 13 of 82 slices shown, 15 images]
[im 5/82  soft-tissue]
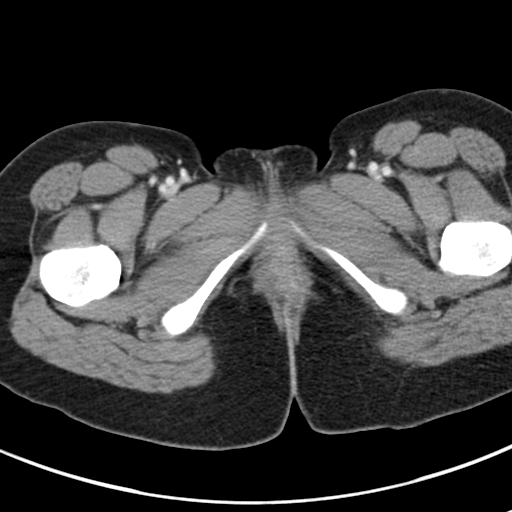
[im 5/82  bone]
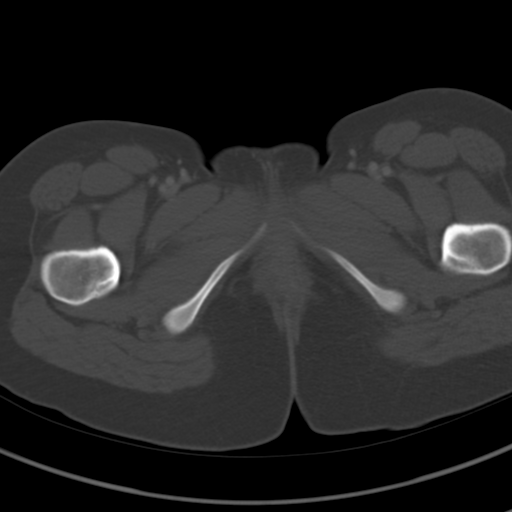
[im 13/82  soft-tissue]
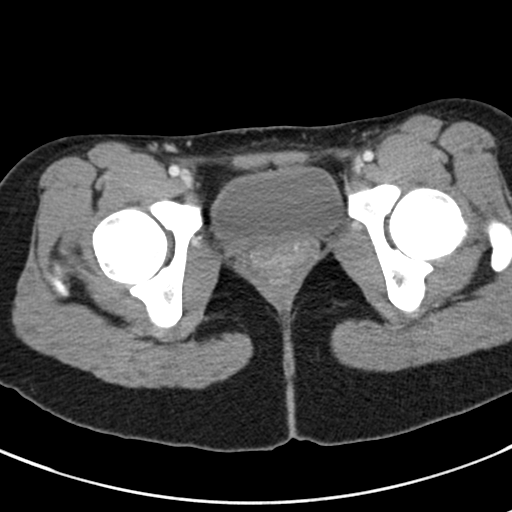
[im 17/82  soft-tissue]
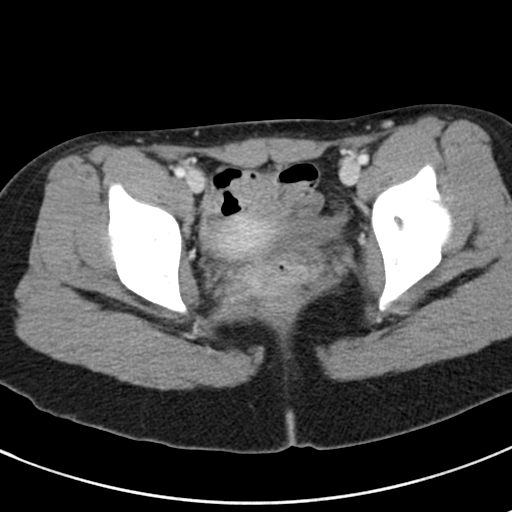
[im 25/82  soft-tissue]
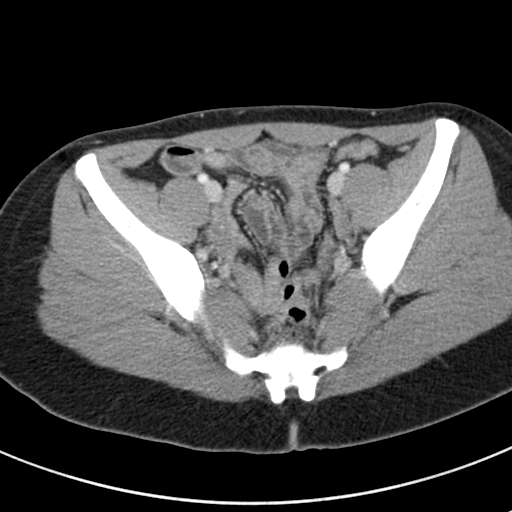
[im 29/82  soft-tissue]
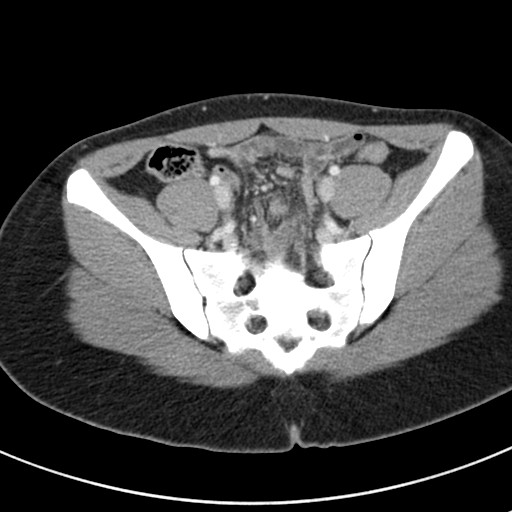
[im 37/82  soft-tissue]
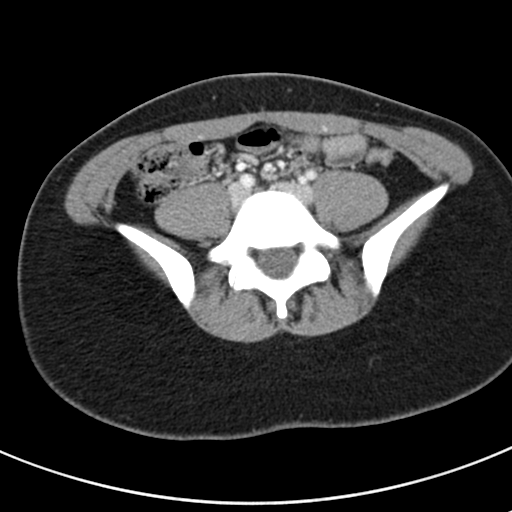
[im 41/82  soft-tissue]
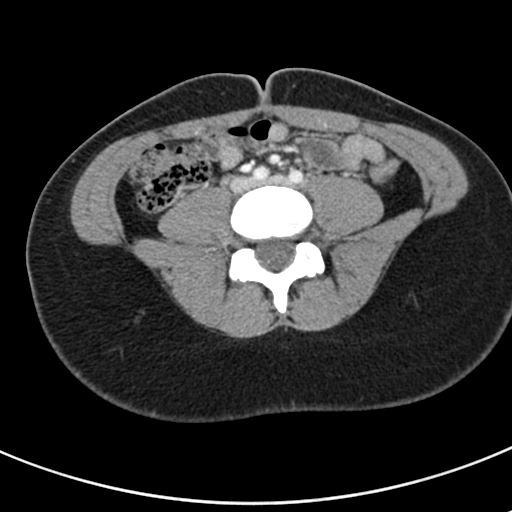
[im 45/82  soft-tissue]
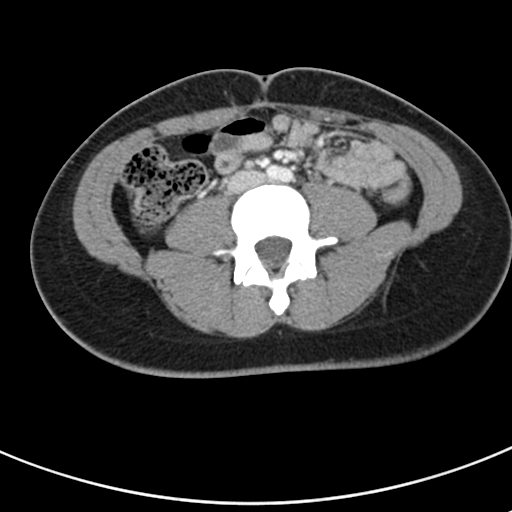
[im 53/82  soft-tissue]
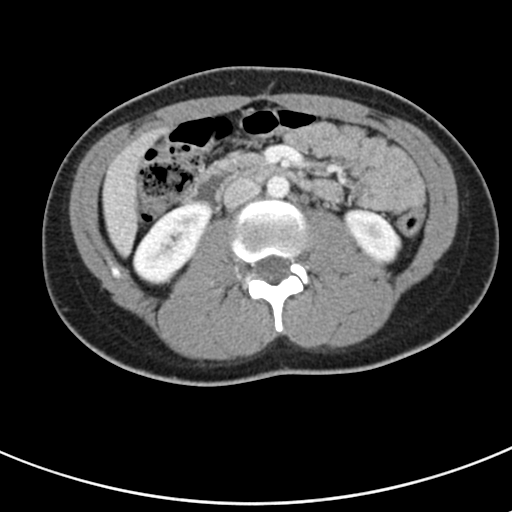
[im 53/82  bone]
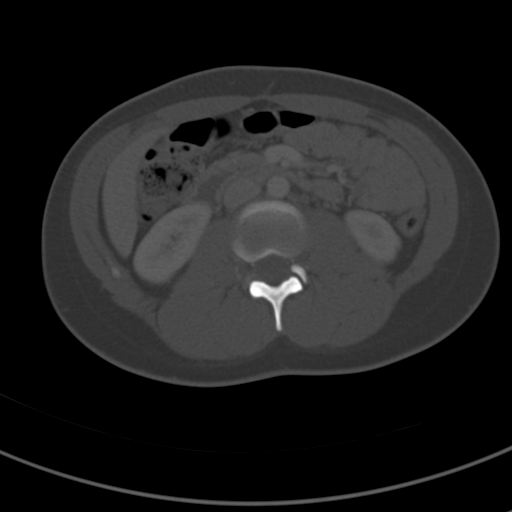
[im 57/82  soft-tissue]
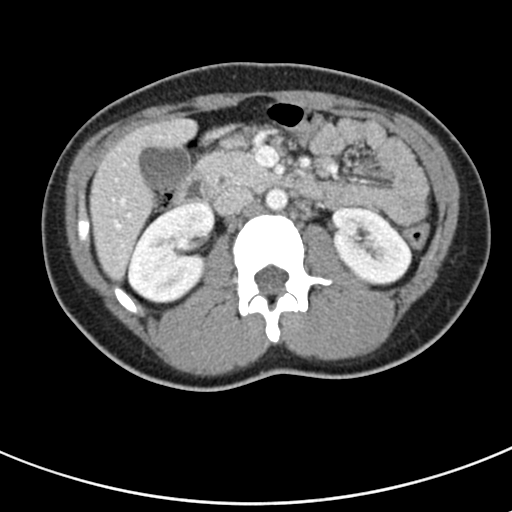
[im 65/82  soft-tissue]
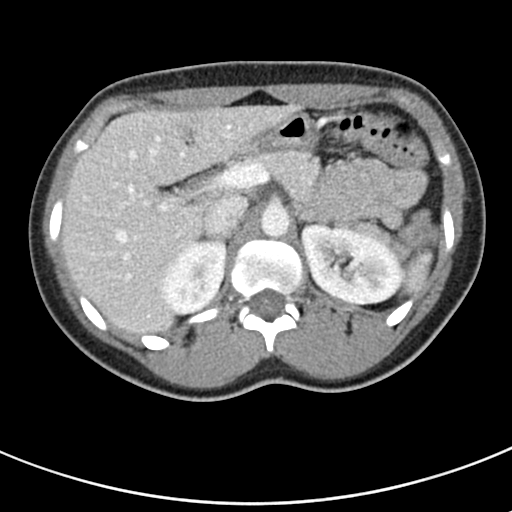
[im 69/82  soft-tissue]
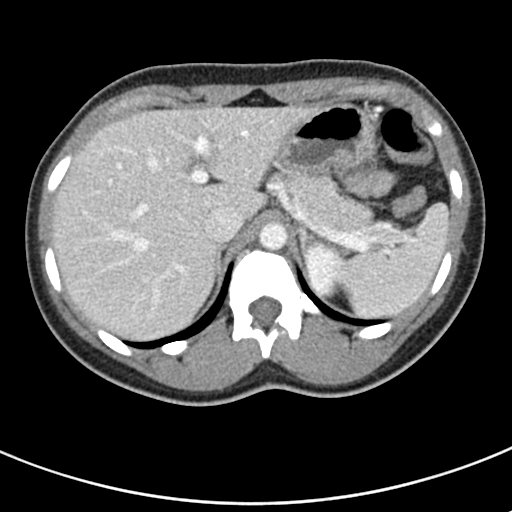
[im 77/82  soft-tissue]
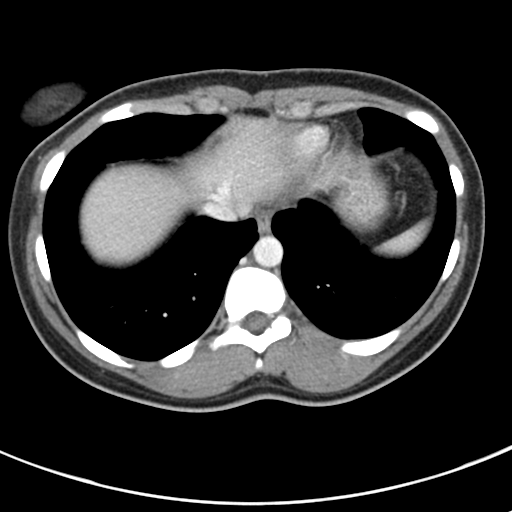

[Series 5: coronal st · coronal · 0.59mm/px · 3 of 70 slices shown]
[im 24/70  soft-tissue]
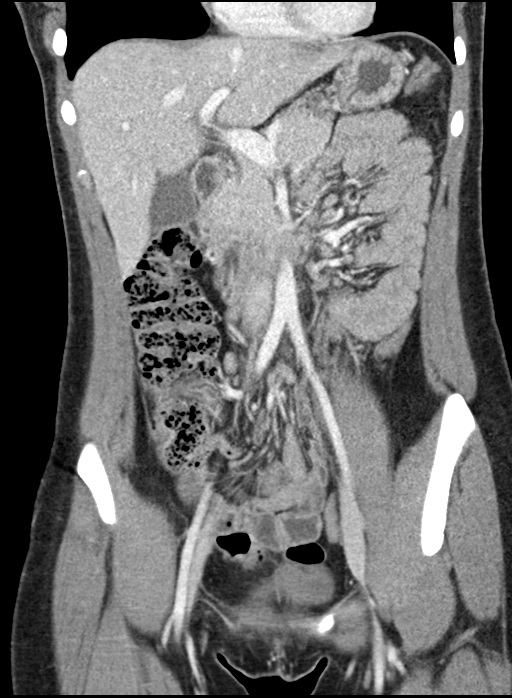
[im 31/70  soft-tissue]
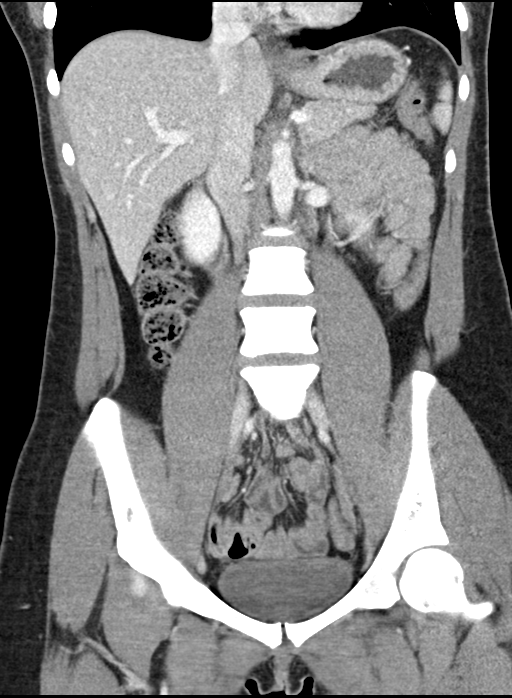
[im 39/70  soft-tissue]
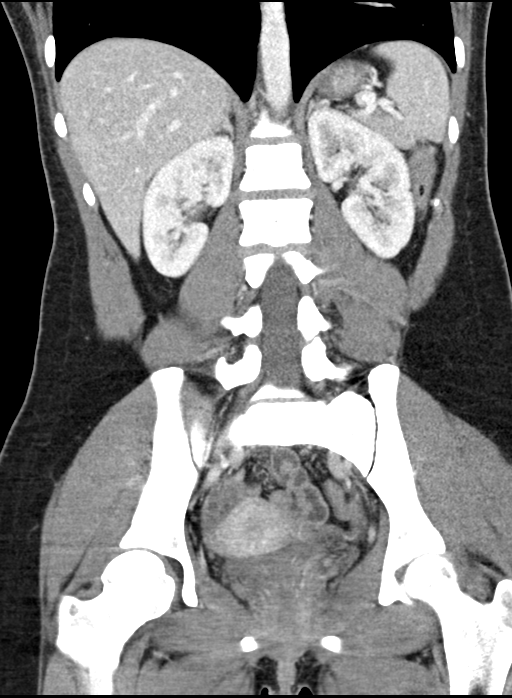

[16 of 46 positions shown; findings below may reference images not displayed]

FINDINGS: Lower chest: Visualized lung bases are clear.

Hepatobiliary: Subcentimeter hypodensity within the posterior right
hepatic lobe too small the characterize, but of doubtful
significance. Gallbladder within normal limits. No biliary
dilatation.

Pancreas: Pancreas within normal limits.

Spleen: Unremarkable.

Adrenals/Urinary Tract: Adrenal glands are normal. Kidneys equal
size with symmetric enhancement. No nephrolithiasis, hydronephrosis
or focal enhancing renal mass. No appreciable hydroureter. Bladder
within normal limits.

Stomach/Bowel: Stomach within normal limits. No evidence for bowel
obstruction. Normal appendix. No acute inflammatory changes seen
about the bowels.

Vascular/Lymphatic: Normal intravascular enhancement seen throughout
the intra-abdominal aorta. Mesenteric vessels patent proximally. No
adenopathy.

Reproductive: Uterus and ovaries within normal limits for age.

Other: No free air or fluid.

Musculoskeletal: No acute osseous abnormality. No discrete lytic or
blastic osseous lesions.
IMPRESSION: No CT evidence for acute intra-abdominal or pelvic process.

## 2021-05-20 ENCOUNTER — Emergency Department
Admission: EM | Admit: 2021-05-20 | Discharge: 2021-05-20 | Discharge status: Against Medical Advice | Payer: Managed Care, Other (non HMO)

## 2021-05-20 NOTE — ED Notes (Signed)
No answer in lobby x3 when called for triage °
